# Patient Record
Sex: Female | Born: 1935
Health system: Southern US, Community
[De-identification: ages and names within clinical notes are randomized; demographics above are authoritative.]

---

## 2016-10-26 ENCOUNTER — Other Ambulatory Visit (HOSPITAL_COMMUNITY): Payer: Self-pay

## 2016-10-26 ENCOUNTER — Inpatient Hospital Stay
Admission: RE | Admit: 2016-10-26 | Discharge: 2016-11-16 | Disposition: A | Discharge status: Home Health | Payer: Self-pay | Attending: Internal Medicine | Admitting: Internal Medicine

## 2016-10-26 DIAGNOSIS — J90 Pleural effusion, not elsewhere classified: Secondary | ICD-10-CM

## 2016-10-26 DIAGNOSIS — J811 Chronic pulmonary edema: Secondary | ICD-10-CM

## 2016-10-26 DIAGNOSIS — J189 Pneumonia, unspecified organism: Secondary | ICD-10-CM

## 2016-10-26 DIAGNOSIS — I509 Heart failure, unspecified: Secondary | ICD-10-CM

## 2016-10-26 DIAGNOSIS — J449 Chronic obstructive pulmonary disease, unspecified: Secondary | ICD-10-CM

## 2016-10-26 DIAGNOSIS — J9690 Respiratory failure, unspecified, unspecified whether with hypoxia or hypercapnia: Secondary | ICD-10-CM

## 2016-10-27 LAB — CBC WITH DIFFERENTIAL/PLATELET
BASOS ABS: 0 10*3/uL (ref 0.0–0.1)
Basophils Relative: 0 %
EOS ABS: 0.2 10*3/uL (ref 0.0–0.7)
EOS PCT: 3 %
HCT: 32.8 % — ABNORMAL LOW (ref 36.0–46.0)
HEMOGLOBIN: 10.3 g/dL — AB (ref 12.0–15.0)
LYMPHS ABS: 0.8 10*3/uL (ref 0.7–4.0)
LYMPHS PCT: 11 %
MCH: 30.7 pg (ref 26.0–34.0)
MCHC: 31.4 g/dL (ref 30.0–36.0)
MCV: 97.9 fL (ref 78.0–100.0)
Monocytes Absolute: 0.4 10*3/uL (ref 0.1–1.0)
Monocytes Relative: 6 %
NEUTROS PCT: 80 %
Neutro Abs: 5.7 10*3/uL (ref 1.7–7.7)
PLATELETS: 113 10*3/uL — AB (ref 150–400)
RBC: 3.35 MIL/uL — AB (ref 3.87–5.11)
RDW: 14.8 % (ref 11.5–15.5)
WBC: 7.2 10*3/uL (ref 4.0–10.5)

## 2016-10-27 LAB — COMPREHENSIVE METABOLIC PANEL
ALBUMIN: 2.3 g/dL — AB (ref 3.5–5.0)
ALK PHOS: 33 U/L — AB (ref 38–126)
ALT: 51 U/L (ref 14–54)
AST: 30 U/L (ref 15–41)
Anion gap: 7 (ref 5–15)
BUN: 15 mg/dL (ref 6–20)
CALCIUM: 8.6 mg/dL — AB (ref 8.9–10.3)
CHLORIDE: 111 mmol/L (ref 101–111)
CO2: 25 mmol/L (ref 22–32)
CREATININE: 0.63 mg/dL (ref 0.44–1.00)
GFR calc Af Amer: >60 mL/min (ref >60)
GFR calc non Af Amer: >60 mL/min (ref >60)
GLUCOSE: 74 mg/dL (ref 65–99)
Potassium: 3.6 mmol/L (ref 3.5–5.1)
SODIUM: 143 mmol/L (ref 135–145)
Total Bilirubin: 0.5 mg/dL (ref 0.3–1.2)
Total Protein: 4.5 g/dL — ABNORMAL LOW (ref 6.5–8.1)

## 2016-10-27 LAB — TSH: TSH: 2.51 u[IU]/mL (ref 0.350–4.500)

## 2016-10-27 LAB — PHOSPHORUS: Phosphorus: 4.2 mg/dL (ref 2.5–4.6)

## 2016-10-27 LAB — PROTIME-INR
INR: 1.12
PROTHROMBIN TIME: 14.4 s (ref 11.4–15.2)

## 2016-10-27 LAB — BRAIN NATRIURETIC PEPTIDE: B NATRIURETIC PEPTIDE 5: 2188.2 pg/mL — AB (ref 0.0–100.0)

## 2016-10-27 LAB — T4, FREE: FREE T4: 0.97 ng/dL (ref 0.61–1.12)

## 2016-10-27 LAB — MAGNESIUM: Magnesium: 1.9 mg/dL (ref 1.7–2.4)

## 2016-10-28 LAB — HEMOGLOBIN A1C
HEMOGLOBIN A1C: 5.8 % — AB (ref 4.8–5.6)
Mean Plasma Glucose: 120 mg/dL

## 2016-10-29 ENCOUNTER — Other Ambulatory Visit (HOSPITAL_COMMUNITY): Payer: Self-pay

## 2016-10-29 LAB — CBC WITH DIFFERENTIAL/PLATELET
Basophils Absolute: 0 10*3/uL (ref 0.0–0.1)
Basophils Relative: 0 %
Eosinophils Absolute: 0.3 10*3/uL (ref 0.0–0.7)
Eosinophils Relative: 3 %
HEMATOCRIT: 35.6 % — AB (ref 36.0–46.0)
HEMOGLOBIN: 10.9 g/dL — AB (ref 12.0–15.0)
LYMPHS ABS: 1 10*3/uL (ref 0.7–4.0)
LYMPHS PCT: 13 %
MCH: 29.8 pg (ref 26.0–34.0)
MCHC: 30.6 g/dL (ref 30.0–36.0)
MCV: 97.3 fL (ref 78.0–100.0)
MONOS PCT: 8 %
Monocytes Absolute: 0.6 10*3/uL (ref 0.1–1.0)
NEUTROS PCT: 76 %
Neutro Abs: 6.1 10*3/uL (ref 1.7–7.7)
Platelets: 140 10*3/uL — ABNORMAL LOW (ref 150–400)
RBC: 3.66 MIL/uL — AB (ref 3.87–5.11)
RDW: 14.2 % (ref 11.5–15.5)
WBC: 7.9 10*3/uL (ref 4.0–10.5)

## 2016-10-29 LAB — BASIC METABOLIC PANEL
Anion gap: 10 (ref 5–15)
BUN: 11 mg/dL (ref 6–20)
CHLORIDE: 104 mmol/L (ref 101–111)
CO2: 30 mmol/L (ref 22–32)
Calcium: 8.6 mg/dL — ABNORMAL LOW (ref 8.9–10.3)
Creatinine, Ser: 0.56 mg/dL (ref 0.44–1.00)
GFR calc Af Amer: >60 mL/min (ref >60)
GFR calc non Af Amer: >60 mL/min (ref >60)
GLUCOSE: 105 mg/dL — AB (ref 65–99)
POTASSIUM: 3.4 mmol/L — AB (ref 3.5–5.1)
Sodium: 144 mmol/L (ref 135–145)

## 2016-10-29 LAB — PHOSPHORUS: Phosphorus: 3.4 mg/dL (ref 2.5–4.6)

## 2016-10-29 LAB — MAGNESIUM: MAGNESIUM: 1.8 mg/dL (ref 1.7–2.4)

## 2016-10-30 LAB — BASIC METABOLIC PANEL
Anion gap: 7 (ref 5–15)
BUN: 9 mg/dL (ref 6–20)
CHLORIDE: 102 mmol/L (ref 101–111)
CO2: 36 mmol/L — ABNORMAL HIGH (ref 22–32)
CREATININE: 0.72 mg/dL (ref 0.44–1.00)
Calcium: 8.9 mg/dL (ref 8.9–10.3)
Glucose, Bld: 106 mg/dL — ABNORMAL HIGH (ref 65–99)
POTASSIUM: 3.5 mmol/L (ref 3.5–5.1)
SODIUM: 145 mmol/L (ref 135–145)

## 2016-10-31 ENCOUNTER — Other Ambulatory Visit (HOSPITAL_BASED_OUTPATIENT_CLINIC_OR_DEPARTMENT_OTHER): Payer: Self-pay

## 2016-10-31 ENCOUNTER — Other Ambulatory Visit (HOSPITAL_COMMUNITY): Payer: Self-pay

## 2016-10-31 DIAGNOSIS — I509 Heart failure, unspecified: Secondary | ICD-10-CM

## 2016-10-31 LAB — CBC WITH DIFFERENTIAL/PLATELET
BASOS ABS: 0 10*3/uL (ref 0.0–0.1)
Basophils Relative: 0 %
EOS ABS: 0.1 10*3/uL (ref 0.0–0.7)
Eosinophils Relative: 2 %
HCT: 33.5 % — ABNORMAL LOW (ref 36.0–46.0)
HEMOGLOBIN: 10.3 g/dL — AB (ref 12.0–15.0)
Lymphocytes Relative: 11 %
Lymphs Abs: 0.8 10*3/uL (ref 0.7–4.0)
MCH: 30.7 pg (ref 26.0–34.0)
MCHC: 30.7 g/dL (ref 30.0–36.0)
MCV: 100 fL (ref 78.0–100.0)
Monocytes Absolute: 0.5 10*3/uL (ref 0.1–1.0)
Monocytes Relative: 6 %
NEUTROS PCT: 81 %
Neutro Abs: 6.2 10*3/uL (ref 1.7–7.7)
PLATELETS: 123 10*3/uL — AB (ref 150–400)
RBC: 3.35 MIL/uL — AB (ref 3.87–5.11)
RDW: 14.5 % (ref 11.5–15.5)
WBC: 7.7 10*3/uL (ref 4.0–10.5)

## 2016-10-31 LAB — RENAL FUNCTION PANEL
ALBUMIN: 2.4 g/dL — AB (ref 3.5–5.0)
ANION GAP: 10 (ref 5–15)
BUN: 9 mg/dL (ref 6–20)
CALCIUM: 8.8 mg/dL — AB (ref 8.9–10.3)
CO2: 36 mmol/L — AB (ref 22–32)
CREATININE: 0.69 mg/dL (ref 0.44–1.00)
Chloride: 98 mmol/L — ABNORMAL LOW (ref 101–111)
Glucose, Bld: 102 mg/dL — ABNORMAL HIGH (ref 65–99)
PHOSPHORUS: 3.5 mg/dL (ref 2.5–4.6)
Potassium: 3.5 mmol/L (ref 3.5–5.1)
SODIUM: 144 mmol/L (ref 135–145)

## 2016-10-31 LAB — MAGNESIUM: MAGNESIUM: 1.8 mg/dL (ref 1.7–2.4)

## 2016-10-31 LAB — RAPID STREP SCREEN (MED CTR MEBANE ONLY): STREPTOCOCCUS, GROUP A SCREEN (DIRECT): NEGATIVE

## 2016-10-31 LAB — BRAIN NATRIURETIC PEPTIDE: B NATRIURETIC PEPTIDE 5: 1691.7 pg/mL — AB (ref 0.0–100.0)

## 2016-10-31 NOTE — Progress Notes (Signed)
  Echocardiogram 2D Echocardiogram limited has been performed.  Janalyn HarderWest, Emily Forse R 10/31/2016, 4:17 PM

## 2016-11-02 ENCOUNTER — Other Ambulatory Visit (HOSPITAL_COMMUNITY): Payer: Self-pay

## 2016-11-02 LAB — RENAL FUNCTION PANEL
ALBUMIN: 2.2 g/dL — AB (ref 3.5–5.0)
Anion gap: 7 (ref 5–15)
BUN: 10 mg/dL (ref 6–20)
CALCIUM: 8.9 mg/dL (ref 8.9–10.3)
CHLORIDE: 99 mmol/L — AB (ref 101–111)
CO2: 38 mmol/L — ABNORMAL HIGH (ref 22–32)
CREATININE: 0.75 mg/dL (ref 0.44–1.00)
Glucose, Bld: 96 mg/dL (ref 65–99)
Phosphorus: 4.7 mg/dL — ABNORMAL HIGH (ref 2.5–4.6)
Potassium: 3.3 mmol/L — ABNORMAL LOW (ref 3.5–5.1)
SODIUM: 144 mmol/L (ref 135–145)

## 2016-11-02 LAB — CBC WITH DIFFERENTIAL/PLATELET
BASOS ABS: 0 10*3/uL (ref 0.0–0.1)
BASOS PCT: 0 %
EOS ABS: 0.1 10*3/uL (ref 0.0–0.7)
EOS PCT: 2 %
HCT: 31.3 % — ABNORMAL LOW (ref 36.0–46.0)
HEMOGLOBIN: 9.4 g/dL — AB (ref 12.0–15.0)
Lymphocytes Relative: 18 %
Lymphs Abs: 1.2 10*3/uL (ref 0.7–4.0)
MCH: 29.9 pg (ref 26.0–34.0)
MCHC: 30 g/dL (ref 30.0–36.0)
MCV: 99.7 fL (ref 78.0–100.0)
Monocytes Absolute: 0.4 10*3/uL (ref 0.1–1.0)
Monocytes Relative: 7 %
NEUTROS PCT: 73 %
Neutro Abs: 4.6 10*3/uL (ref 1.7–7.7)
PLATELETS: 132 10*3/uL — AB (ref 150–400)
RBC: 3.14 MIL/uL — AB (ref 3.87–5.11)
RDW: 14.6 % (ref 11.5–15.5)
WBC: 6.4 10*3/uL (ref 4.0–10.5)

## 2016-11-02 LAB — CULTURE, GROUP A STREP (THRC): SPECIAL REQUESTS: NORMAL

## 2016-11-02 LAB — MAGNESIUM: MAGNESIUM: 1.9 mg/dL (ref 1.7–2.4)

## 2016-11-04 LAB — BASIC METABOLIC PANEL
Anion gap: 7 (ref 5–15)
BUN: 13 mg/dL (ref 6–20)
CHLORIDE: 100 mmol/L — AB (ref 101–111)
CO2: 37 mmol/L — AB (ref 22–32)
CREATININE: 0.84 mg/dL (ref 0.44–1.00)
Calcium: 9.1 mg/dL (ref 8.9–10.3)
GFR calc non Af Amer: 59 mL/min — ABNORMAL LOW (ref >60)
Glucose, Bld: 99 mg/dL (ref 65–99)
POTASSIUM: 3.6 mmol/L (ref 3.5–5.1)
SODIUM: 144 mmol/L (ref 135–145)

## 2016-11-08 LAB — BASIC METABOLIC PANEL
ANION GAP: 9 (ref 5–15)
BUN: 16 mg/dL (ref 6–20)
CHLORIDE: 99 mmol/L — AB (ref 101–111)
CO2: 35 mmol/L — ABNORMAL HIGH (ref 22–32)
CREATININE: 0.93 mg/dL (ref 0.44–1.00)
Calcium: 9 mg/dL (ref 8.9–10.3)
GFR calc non Af Amer: 53 mL/min — ABNORMAL LOW (ref >60)
Glucose, Bld: 121 mg/dL — ABNORMAL HIGH (ref 65–99)
POTASSIUM: 3 mmol/L — AB (ref 3.5–5.1)
SODIUM: 143 mmol/L (ref 135–145)

## 2016-11-08 LAB — CBC
HEMATOCRIT: 33.4 % — AB (ref 36.0–46.0)
Hemoglobin: 10.1 g/dL — ABNORMAL LOW (ref 12.0–15.0)
MCH: 29.8 pg (ref 26.0–34.0)
MCHC: 30.2 g/dL (ref 30.0–36.0)
MCV: 98.5 fL (ref 78.0–100.0)
PLATELETS: 206 10*3/uL (ref 150–400)
RBC: 3.39 MIL/uL — AB (ref 3.87–5.11)
RDW: 14.4 % (ref 11.5–15.5)
WBC: 5.2 10*3/uL (ref 4.0–10.5)

## 2016-11-08 LAB — MAGNESIUM: Magnesium: 1.8 mg/dL (ref 1.7–2.4)

## 2016-11-08 LAB — PHOSPHORUS: Phosphorus: 4.3 mg/dL (ref 2.5–4.6)

## 2016-11-10 LAB — BASIC METABOLIC PANEL
Anion gap: 10 (ref 5–15)
BUN: 18 mg/dL (ref 6–20)
CHLORIDE: 105 mmol/L (ref 101–111)
CO2: 28 mmol/L (ref 22–32)
CREATININE: 1 mg/dL (ref 0.44–1.00)
Calcium: 9.3 mg/dL (ref 8.9–10.3)
GFR calc Af Amer: 56 mL/min — ABNORMAL LOW (ref >60)
GFR calc non Af Amer: 48 mL/min — ABNORMAL LOW (ref >60)
Glucose, Bld: 106 mg/dL — ABNORMAL HIGH (ref 65–99)
POTASSIUM: 3.7 mmol/L (ref 3.5–5.1)
Sodium: 143 mmol/L (ref 135–145)

## 2016-11-10 LAB — PHOSPHORUS: PHOSPHORUS: 4.7 mg/dL — AB (ref 2.5–4.6)

## 2016-11-10 LAB — MAGNESIUM: MAGNESIUM: 1.9 mg/dL (ref 1.7–2.4)

## 2017-06-24 IMAGING — CR DG CHEST 1V PORT
1 series · 1 of 1 positions shown · non-contrast
Comparison: None.

CLINICAL DATA: Respiratory failure and pneumonia.

EXAM:
PORTABLE CHEST 1 VIEW

[AP]
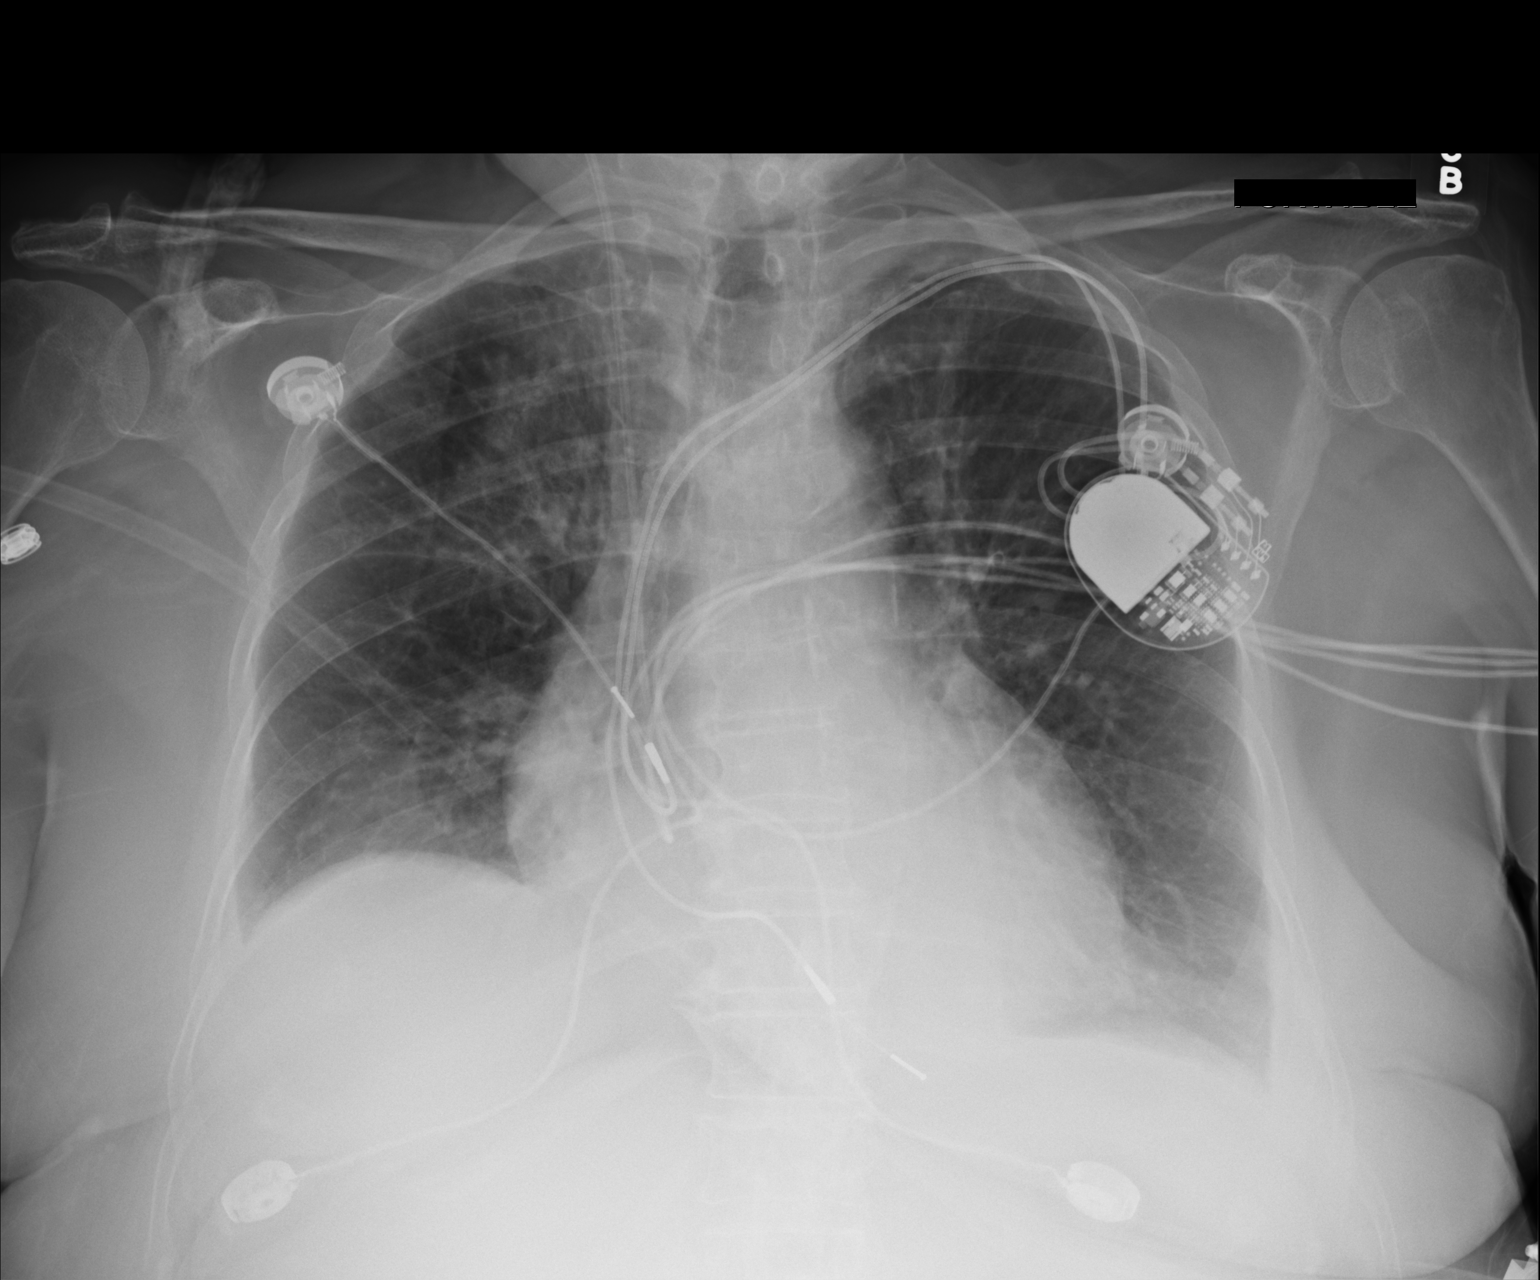

[1 of 1 positions shown; findings below may reference images not displayed]

FINDINGS: Left-sided pacemaker intact. Right IJ central venous catheter has
tip just above the cavoatrial junction. Lungs are adequately
inflated demonstrate hazy airspace opacification over the medial
right upper lobe as well as lung bases suggesting multifocal
infection. Borderline cardiomegaly. Minimal calcified plaque over
the thoracic aorta. There are degenerative changes of the spine.
IMPRESSION: Hazy multifocal airspace process over the right upper lobe and lung
bases suggesting multifocal pneumonia.

Right IJ central venous catheter with tip just above the cavoatrial
junction.

## 2017-06-27 IMAGING — CR DG CHEST 1V PORT
1 series · 1 of 1 positions shown · non-contrast
Comparison: October 26, 2016

CLINICAL DATA: Shortness of breath and chest pain

EXAM:
PORTABLE CHEST 1 VIEW

[portable]
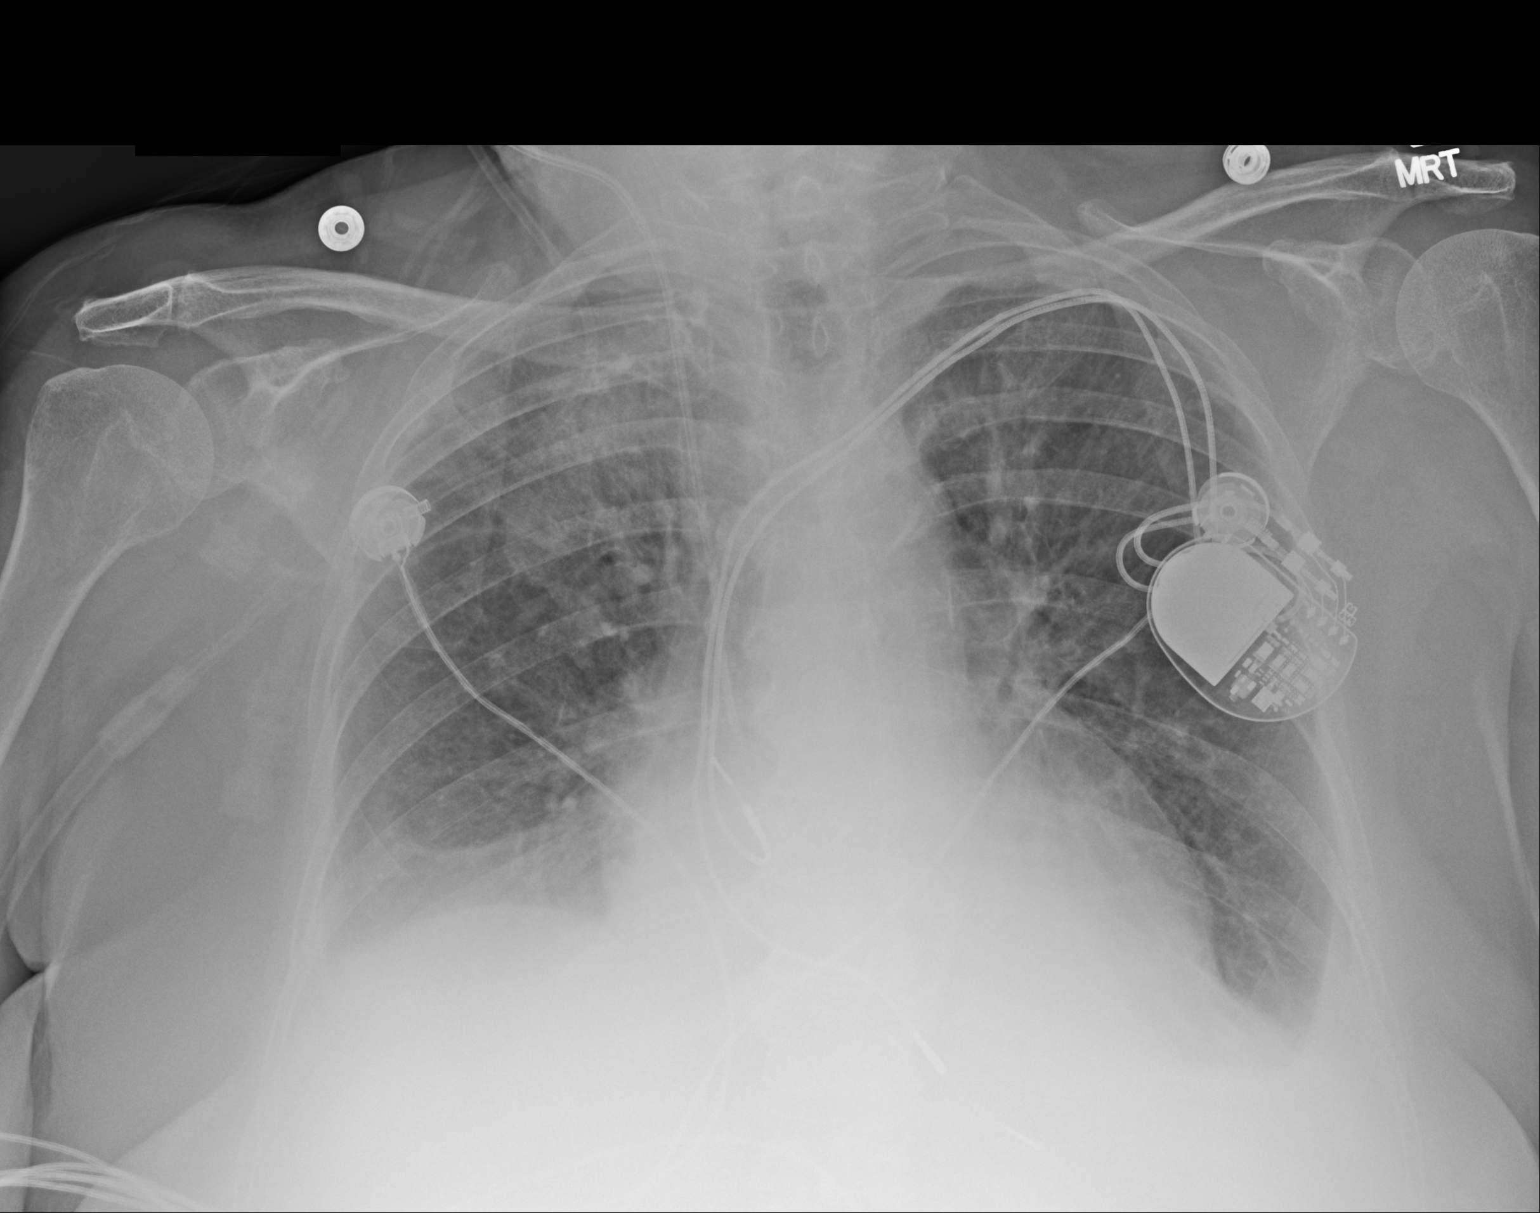

[1 of 1 positions shown; findings below may reference images not displayed]

FINDINGS: Patchy airspace opacity is again noted in the right upper lobe,
right base, and left base regions. There is slight increase in
patchy opacity in the left upper lobe compared to most recent study.
There is cardiomegaly with pulmonary venous hypertension. Pacemaker
leads are attached to the right atrium and right ventricle. Central
catheter tip is at the cavoatrial junction, stable. There is
atherosclerotic calcification in the aorta. No evident adenopathy.
There are small pleural effusions bilaterally.
IMPRESSION: There is pulmonary vascular congestion. Areas of airspace opacity at
multiple sites is again noted with slight increased opacity in the
left upper lobe and otherwise essentially stable changes. Small
bilateral pleural effusions are evident. Suspect multifocal
pneumonia superimposed on pulmonary vascular congestion. There may
be a degree of underlying frank congestive heart failure as well.

## 2017-07-01 IMAGING — CR DG CHEST 1V PORT
1 series · 1 of 1 positions shown · non-contrast
Comparison: 10/31/2016 chest x-ray.

CLINICAL DATA: [AGE] female with pleural effusions and
pneumonia. Subsequent encounter.

EXAM:
PORTABLE CHEST 1 VIEW

[ap portable]
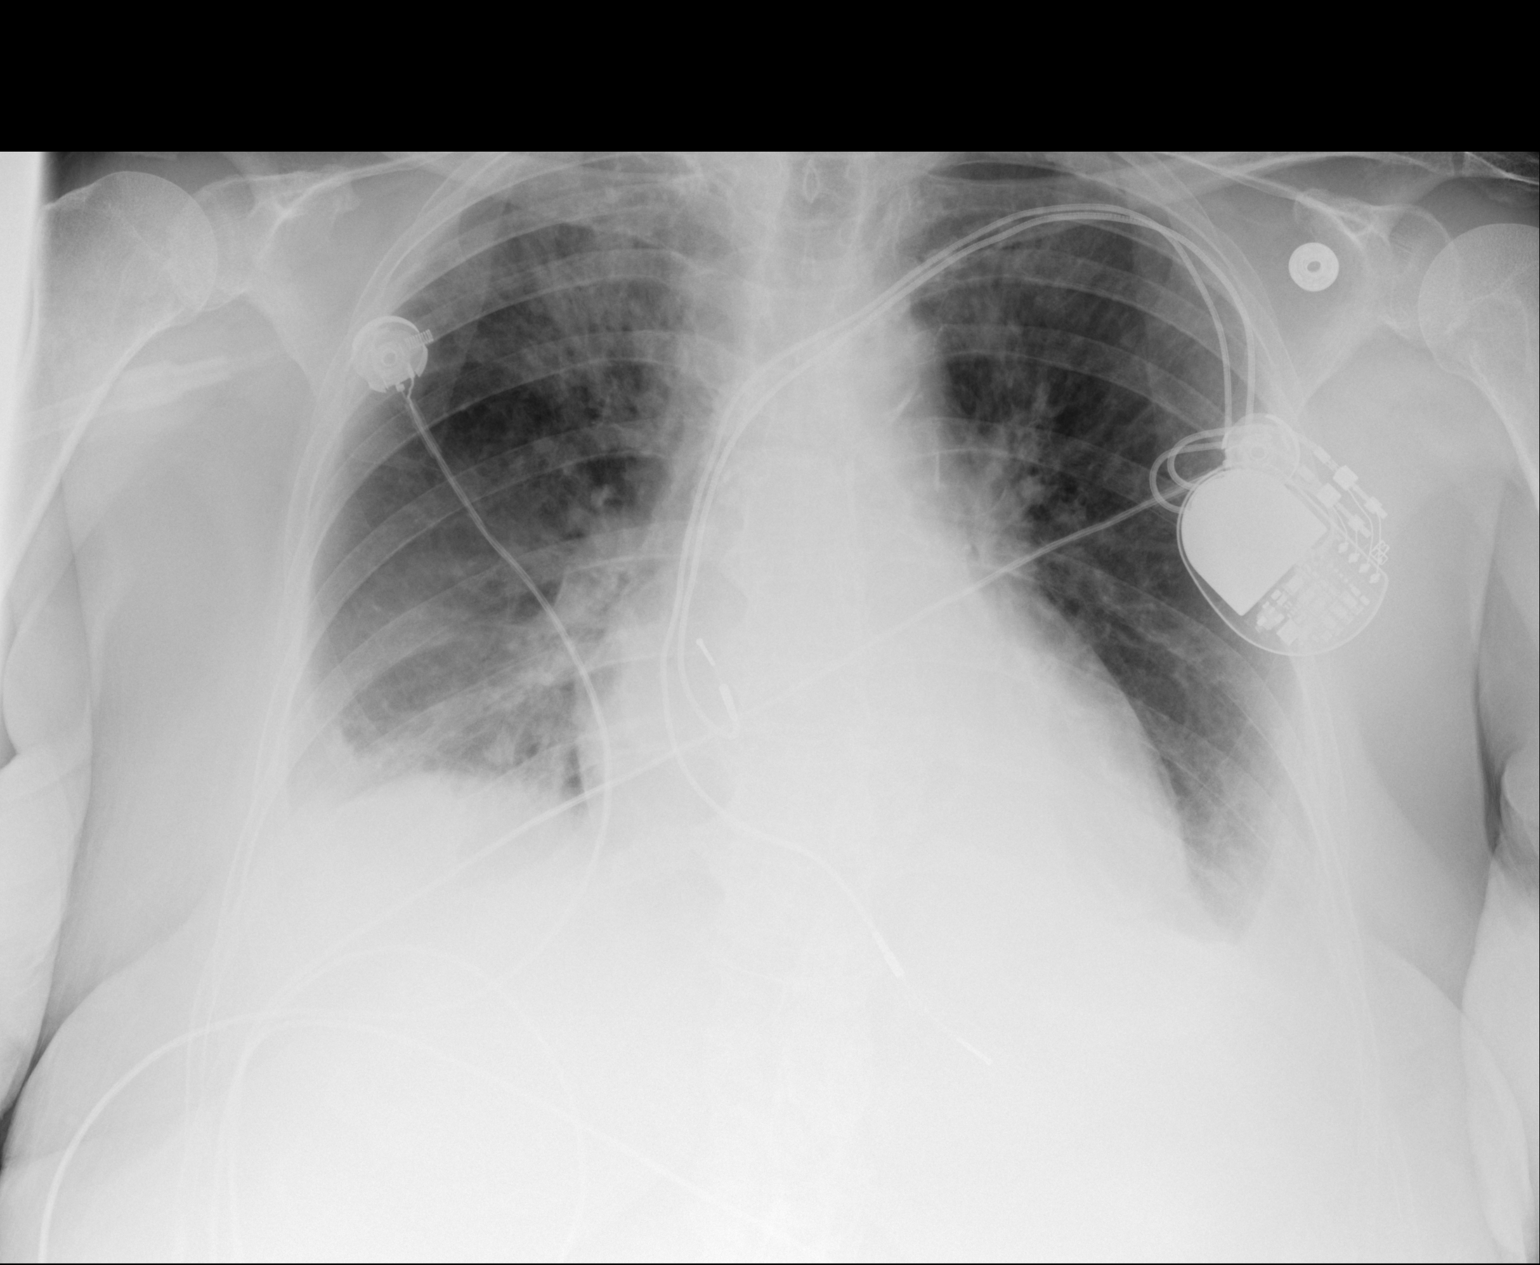

[1 of 1 positions shown; findings below may reference images not displayed]

FINDINGS: Sequential pacer in place entering from left with leads unchanged in
position. Heart size top-normal.

Asymmetric airspace disease greater on right with appearance of
bilateral pleural effusions. This may represent slightly asymmetric
pulmonary edema. In proper clinical setting, superimposed infectious
infiltrate within the right upper lobe and right lung base cannot be
entirely excluded.

Calcified mildly tortuous aorta.
IMPRESSION: Overall no significant change in appearance of asymmetric airspace
disease which may represent pulmonary edema with small bilateral
pleural effusions. Infectious infiltrate not excluded in proper
clinical setting.

## 2018-06-09 ENCOUNTER — Other Ambulatory Visit: Payer: Self-pay | Admitting: Family Medicine

## 2018-06-09 DIAGNOSIS — S32010A Wedge compression fracture of first lumbar vertebra, initial encounter for closed fracture: Secondary | ICD-10-CM

## 2018-06-21 ENCOUNTER — Other Ambulatory Visit: Payer: Self-pay

## 2018-07-05 ENCOUNTER — Telehealth: Payer: Self-pay | Admitting: Radiology

## 2018-07-05 NOTE — Telephone Encounter (Signed)
Left message requesting patient call back for status update 1 week s/p Kyphoplasty.  Estelle Greenleaf DightonGales, RN 07/05/2018 10:30 AM

## 2018-07-05 NOTE — Telephone Encounter (Signed)
Patient states that she is doing well 1 wk s/p Kyphoplasty.  States that she is not experiencing pain and that she feels that her recovery has been very good.  She states that as she lives in [place] and is without problems at this time, she prefers to future follow ups only if needed.  Izreal Kock McNabbGales, RN 07/05/2018 10:50 AM
